# Patient Record
Sex: Female | Born: 1970 | Race: Black or African American | Hispanic: No | Marital: Married | Smoking: Never smoker
Health system: Southern US, Community
[De-identification: ages and names within clinical notes are randomized; demographics above are authoritative.]

---

## 2015-02-26 ENCOUNTER — Ambulatory Visit (INDEPENDENT_AMBULATORY_CARE_PROVIDER_SITE_OTHER): Payer: 59 | Admitting: Osteopathic Medicine

## 2015-02-26 ENCOUNTER — Encounter: Payer: Self-pay | Admitting: Osteopathic Medicine

## 2015-02-26 VITALS — BP 137/83 | HR 83 | Ht 68.0 in | Wt 159.0 lb

## 2015-02-26 DIAGNOSIS — R8290 Unspecified abnormal findings in urine: Secondary | ICD-10-CM

## 2015-02-26 DIAGNOSIS — R634 Abnormal weight loss: Secondary | ICD-10-CM | POA: Diagnosis not present

## 2015-02-26 DIAGNOSIS — IMO0001 Reserved for inherently not codable concepts without codable children: Secondary | ICD-10-CM

## 2015-02-26 DIAGNOSIS — K5900 Constipation, unspecified: Secondary | ICD-10-CM

## 2015-02-26 DIAGNOSIS — R221 Localized swelling, mass and lump, neck: Secondary | ICD-10-CM | POA: Diagnosis not present

## 2015-02-26 DIAGNOSIS — K5909 Other constipation: Secondary | ICD-10-CM

## 2015-02-26 DIAGNOSIS — Z1239 Encounter for other screening for malignant neoplasm of breast: Secondary | ICD-10-CM

## 2015-02-26 DIAGNOSIS — R519 Headache, unspecified: Secondary | ICD-10-CM | POA: Insufficient documentation

## 2015-02-26 DIAGNOSIS — R51 Headache: Secondary | ICD-10-CM | POA: Diagnosis not present

## 2015-02-26 DIAGNOSIS — R030 Elevated blood-pressure reading, without diagnosis of hypertension: Secondary | ICD-10-CM

## 2015-02-26 DIAGNOSIS — G8929 Other chronic pain: Secondary | ICD-10-CM

## 2015-02-26 DIAGNOSIS — Z13220 Encounter for screening for lipoid disorders: Secondary | ICD-10-CM

## 2015-02-26 MED ORDER — NAPROXEN 500 MG PO TABS: 500.0000 mg | ORAL_TABLET | Freq: Two times a day (BID) | ORAL | Status: DC | PRN

## 2015-02-26 MED ORDER — BISACODYL 5 MG PO TBEC: 5.0000 mg | DELAYED_RELEASE_TABLET | Freq: Every day | ORAL | Status: DC | PRN

## 2015-02-26 MED ORDER — AMITRIPTYLINE HCL 25 MG PO TABS: 25.0000 mg | ORAL_TABLET | Freq: Every day | ORAL | Status: DC

## 2015-02-26 NOTE — Patient Instructions (Signed)
We have ordered blood work to get done today, will call you with the results of this, we should have these back in the next day or two. Please let us know if you do not hear anything by Friday of this week.  You should hear from our imaging department about scheduling the mammogram and ultrasound of the neck, please let us know if you don't hear anything back by the end of this week. We typically get the report back the day after you have the imaging studies done. Depending on what the radiologist says about the lump in the neck, we may need to pursue further imaging based on their recommendations.  See instructions below for other lifestyle modifications to help with chronic constipation, you have also be given a prescription for stool softener to take as needed or can take daily for 1 - 2 weeks. If your symptoms aren't better in the next week or 2, we will need to talk more about this issue.  For headaches, you have been given 2 medications. First, Naprosyn, if the anti-inflammatory to take as needed. The other, amitriptyline, is to be taken at bedtime as a daily medication to help prevent headache. You can start this medicine at one pill per night for about a week, if you're doing well on this but not experiencing full relief you may increase to 2 pills per night. Do not go over 2 pills without talking first with Dr. Sheppard Coil. If the medications are not helping your headache, or if your headaches change in character or intensity, we will need to consider MRI or neurology referral for further workup.   Constipation, Adult Constipation is when a person has fewer than three bowel movements a week, has difficulty having a bowel movement, or has stools that are dry, hard, or larger than normal. As people grow older, constipation is more common. A low-fiber diet, not taking in enough fluids, and taking certain medicines may make constipation worse.  CAUSES   Certain medicines, such as antidepressants,  pain medicine, iron supplements, antacids, and water pills.   Certain diseases, such as diabetes, irritable bowel syndrome (IBS), thyroid disease, or depression.   Not drinking enough water.   Not eating enough fiber-rich foods.   Stress or travel.   Lack of physical activity or exercise.   Ignoring the urge to have a bowel movement.   Using laxatives too much.  SIGNS AND SYMPTOMS   Having fewer than three bowel movements a week.   Straining to have a bowel movement.   Having stools that are hard, dry, or larger than normal.   Feeling full or bloated.   Pain in the lower abdomen.   Not feeling relief after having a bowel movement.  DIAGNOSIS  Your health care provider will take a medical history and perform a physical exam. Further testing may be done for severe constipation. Some tests may include:  A barium enema X-ray to examine your rectum, colon, and, sometimes, your small intestine.   A sigmoidoscopy to examine your lower colon.   A colonoscopy to examine your entire colon. TREATMENT  Treatment will depend on the severity of your constipation and what is causing it. Some dietary treatments include drinking more fluids and eating more fiber-rich foods. Lifestyle treatments may include regular exercise. If these diet and lifestyle recommendations do not help, your health care provider may recommend taking over-the-counter laxative medicines to help you have bowel movements. Prescription medicines may be prescribed if over-the-counter medicines do not  work.  HOME CARE INSTRUCTIONS   Eat foods that have a lot of fiber, such as fruits, vegetables, whole grains, and beans.  Limit foods high in fat and processed sugars, such as french fries, hamburgers, cookies, candies, and soda.   A fiber supplement may be added to your diet if you cannot get enough fiber from foods.   Drink enough fluids to keep your urine clear or pale yellow.   Exercise regularly  or as directed by your health care provider.   Go to the restroom when you have the urge to go. Do not hold it.   Only take over-the-counter or prescription medicines as directed by your health care provider. Do not take other medicines for constipation without talking to your health care provider first.  [place] IF:   You have bright red blood in your stool.   Your constipation lasts for more than 4 days or gets worse.   You have abdominal or rectal pain.   You have thin, pencil-like stools.   You have unexplained weight loss. MAKE SURE YOU:   Understand these instructions.  Will watch your condition.  Will get help right away if you are not doing well or get worse.   This information is not intended to replace advice given to you by your health care provider. Make sure you discuss any questions you have with your health care provider.   Document Released: 10/04/2003 Document Revised: 01/26/2014 Document Reviewed: 10/17/2012 Elsevier Interactive Patient Education [employer].

## 2015-02-26 NOTE — Progress Notes (Signed)
HPI: Wendy Hoover is a 45 y.o. female who presents to [facility] today for chief complaint of:  Chief Complaint  Patient presents with  . Establish Care    LUMP ON NECK . Location: back of neck . Quality: lump, nontender . Duration: been there maybe 6 months . Context: seems getting bigger  . Assoc signs/symptoms: no fever/chills, no neck pain, no numbness/tingling in arms  HEADACHES . Location: all over, no unilateral, (+) pain in back of neck w/ headaches, too . Quality: throbbing . Duration: can go on for 3 days. Started years ago with occasional severe headache maybe once per year.  . Timing: wakes up with these in the morning, dont waler her up at night, not assoc w/ periods or food . Context: used to have migraines maybe once per year  . Modifying factors: Excedrin migraine helps, wers off then she tries some more  . Assoc signs/symptoms: phonophobia, photophobia, no aura symptoms   WEIGHT LOSS - 20 lbs over past year or so, about a year ago was 160s or so. Not doing anything different, no diet changes and no exercise. Assoc signs/symptoms: constipation, fatigue. No hir/skin changes, no palpitations.   CONSTIPATION - can go up to a week without BM, painful to defecate, no blood in stool, tried to keep hydrated, nothing really seems t ohelp.       Past medical, social and family history reviewed: No past medical history on file. No past surgical history on file. Social History  Substance Use Topics  . Smoking status: Not on file  . Smokeless tobacco: Not on file  . Alcohol Use: Not on file   History reviewed. No pertinent family history.  No current outpatient prescriptions on file.   No current facility-administered medications for this visit.   No Known Allergies    Review of Systems: CONSTITUTIONAL:  No  fever, no chills, (+) unintentional weight changes HEAD/EYES/EARS/NOSE/THROAT: (+) headache, no vision change, no hearing  change, No  sore throat, No  sinus pressure CARDIAC: No  chest pain, No  pressure, No palpitations, No  orthopnea RESPIRATORY: No  cough, No  shortness of breath/wheeze GASTROINTESTINAL: No  nausea, No  vomiting, No  abdominal pain, No  blood in stool, No  diarrhea, No  constipation  MUSCULOSKELETAL: No  myalgia/arthralgia GENITOURINARY: No  incontinence, No  abnormal genital bleeding/discharge SKIN: No  Rash/wounds, (+) concerning lesion on neck HEM/ONC: No  easy bruising/bleeding, No  abnormal lymph node ENDOCRINE: No polyuria/polydipsia/polyphagia, No  heat/cold intolerance  NEUROLOGIC: No  weakness, No  dizziness, No  slurred speech PSYCHIATRIC: No  concerns with depression, (+) concerns with anxiety, No sleep problems  Exam:  BP 137/83 mmHg  Pulse 83  Ht _0  (1.727 m)  Wt 159 lb (72.122 kg)  BMI 24.18 kg/m2  Constitutional: VS see above. General Appearance: alert, well-developed, well-nourished, NAD Eyes: Normal lids and conjunctive, non-icteric sclera, PERRLA, EOMI Ears, Nose, Mouth, Throat: MMM, Normal external inspection ears/nares/mouth/lips/gums, TM normal bilaterally. Pharynx no erythema, no exudate.  Neck: No masses, trachea midline. No thyroid enlargement/tenderness/mass appreciated. No lymphadenopathy Respiratory: Normal respiratory effort. no wheeze, no rhonchi, no rales Cardiovascular: S1/S2 normal, no murmur, no rub/gallop auscultated. RRR. No lower extremity edema. Gastrointestinal: Nontender, no masses. No hepatomegaly, no splenomegaly. No hernia appreciated. Bowel sounds normal. Rectal exam deferred.  Musculoskeletal: Gait normal. No clubbing/cyanosis of digits.  Neurological: No cranial nerve deficit on limited exam. Motor and sensation intact and symmetric, normal coordination, EOMI, normal  neck rom Skin: warm, dry, intact. No rash/ulcer. No concerning nevi or subq nodules on limited exam.   Psychiatric: Normal judgment/insight. Normal mood and affect. Oriented x3.     No results found for this or any previous visit (from the past 72 hour(s)).    ASSESSMENT/PLAN: labs and basic w/u as below, will get US neck mass, unable to tell if cyst or adipose tissue but lack of concerning neuro symptoms is reassuring. May be causing tension headaches. Will initiate ppx therapy and prn NSAID for headache, consider MRI/neuro referral, ER precautions reviewed. Weight loss has been gradual, will catch up on cancer screening. Constipaiton also a concern, treat as below and await lab results, may need to consider colonoscopy.   Unintended weight loss - Plan: US Soft Tissue Head/Neck, CBC with Differential/Platelet, COMPLETE METABOLIC PANEL WITH GFR, HIV antibody, TSH, VITAMIN D 25 Hydroxy (Vit-D Deficiency, Fractures), Lactate Dehydrogenase (LDH), Sed Rate (ESR), C-reactive protein, Urinalysis  Chronic nonintractable headache, unspecified headache type - Plan: amitriptyline (ELAVIL) 25 MG tablet, naproxen (NAPROSYN) 500 MG tablet  Lump on neck - Plan: US Soft Tissue Head/Neck  Breast cancer screening - Plan: MM DIGITAL SCREENING BILATERAL  Lipid screening - Plan: Lipid panel  Elevated blood pressure - improved on recheck - Plan: CBC with Differential/Platelet, COMPLETE METABOLIC PANEL WITH GFR, TSH, Urinalysis  Chronic constipation - Plan: bisacodyl (BISACODYL) 5 MG EC tablet    Return if symptoms worsen or fail to improve, and depending on lab results - we will call you.Marland Kitchen

## 2015-02-27 LAB — CBC WITH DIFFERENTIAL/PLATELET
Basophils Absolute: 0.1 10*3/uL (ref 0.0–0.1)
Basophils Relative: 1 % (ref 0–1)
Eosinophils Absolute: 0.1 10*3/uL (ref 0.0–0.7)
Eosinophils Relative: 1 % (ref 0–5)
HCT: 38.3 % (ref 36.0–46.0)
Hemoglobin: 12.7 g/dL (ref 12.0–15.0)
Lymphocytes Relative: 31 % (ref 12–46)
Lymphs Abs: 2 10*3/uL (ref 0.7–4.0)
MCH: 28.2 pg (ref 26.0–34.0)
MCHC: 33.2 g/dL (ref 30.0–36.0)
MCV: 84.9 fL (ref 78.0–100.0)
MPV: 10.9 fL (ref 8.6–12.4)
Monocytes Absolute: 0.5 10*3/uL (ref 0.1–1.0)
Monocytes Relative: 7 % (ref 3–12)
Neutro Abs: 4 10*3/uL (ref 1.7–7.7)
Neutrophils Relative %: 60 % (ref 43–77)
Platelets: 294 10*3/uL (ref 150–400)
RBC: 4.51 MIL/uL (ref 3.87–5.11)
RDW: 14.6 % (ref 11.5–15.5)
WBC: 6.6 10*3/uL (ref 4.0–10.5)

## 2015-02-27 LAB — COMPLETE METABOLIC PANEL WITH GFR
ALT: 7 U/L (ref 6–29)
AST: 17 U/L (ref 10–30)
Albumin: 4.1 g/dL (ref 3.6–5.1)
Alkaline Phosphatase: 64 U/L (ref 33–115)
BUN: 9 mg/dL (ref 7–25)
CO2: 25 mmol/L (ref 20–31)
Calcium: 8.8 mg/dL (ref 8.6–10.2)
Chloride: 104 mmol/L (ref 98–110)
Creat: 0.77 mg/dL (ref 0.50–1.10)
GFR, Est African American: >89 mL/min (ref >=60)
GFR, Est Non African American: >89 mL/min (ref >=60)
Glucose, Bld: 73 mg/dL (ref 65–99)
Potassium: 3.7 mmol/L (ref 3.5–5.3)
Sodium: 138 mmol/L (ref 135–146)
Total Bilirubin: 0.7 mg/dL (ref 0.2–1.2)
Total Protein: 7.7 g/dL (ref 6.1–8.1)

## 2015-02-27 LAB — LACTATE DEHYDROGENASE: LDH: 172 U/L (ref 94–250)

## 2015-02-27 LAB — URINALYSIS
Bilirubin Urine: NEGATIVE
Glucose, UA: NEGATIVE
Ketones, ur: NEGATIVE
Leukocytes, UA: NEGATIVE
Nitrite: NEGATIVE
Specific Gravity, Urine: 1.026 (ref 1.001–1.035)
pH: 6 (ref 5.0–8.0)

## 2015-02-27 LAB — HIV ANTIBODY (ROUTINE TESTING W REFLEX): HIV 1&2 Ab, 4th Generation: NONREACTIVE

## 2015-02-27 LAB — TSH: TSH: 1.01 mIU/L

## 2015-02-27 LAB — LIPID PANEL
CHOL/HDL RATIO: 3.8 ratio (ref <=5.0)
Cholesterol: 172 mg/dL (ref 125–200)
HDL: 45 mg/dL — AB (ref >=46)
LDL CALC: 108 mg/dL (ref <130)
Triglycerides: 93 mg/dL (ref <150)
VLDL: 19 mg/dL (ref <30)

## 2015-02-27 LAB — C-REACTIVE PROTEIN

## 2015-02-27 LAB — VITAMIN D 25 HYDROXY (VIT D DEFICIENCY, FRACTURES): VIT D 25 HYDROXY: 17 ng/mL — AB (ref 30–100)

## 2015-02-27 LAB — SEDIMENTATION RATE: Sed Rate: 15 mm/hr (ref 0–20)

## 2015-02-27 NOTE — Addendum Note (Signed)
Addended by: Maryla Morrow on: 02/27/2015 11:09 AM   Modules accepted: Orders

## 2015-02-28 LAB — URINALYSIS, MICROSCOPIC ONLY
BACTERIA UA: NONE SEEN [HPF]
CASTS: NONE SEEN [LPF]
SQUAMOUS EPITHELIAL / LPF: NONE SEEN [HPF] (ref <=5)
WBC, UA: NONE SEEN WBC/HPF (ref <=5)
YEAST: NONE SEEN [HPF]

## 2015-03-13 ENCOUNTER — Ambulatory Visit (INDEPENDENT_AMBULATORY_CARE_PROVIDER_SITE_OTHER): Payer: 59

## 2015-03-13 DIAGNOSIS — R221 Localized swelling, mass and lump, neck: Secondary | ICD-10-CM | POA: Diagnosis not present

## 2015-03-13 DIAGNOSIS — R634 Abnormal weight loss: Secondary | ICD-10-CM

## 2015-03-13 DIAGNOSIS — Z1231 Encounter for screening mammogram for malignant neoplasm of breast: Secondary | ICD-10-CM | POA: Diagnosis not present

## 2015-03-13 DIAGNOSIS — Z1239 Encounter for other screening for malignant neoplasm of breast: Secondary | ICD-10-CM

## 2015-03-25 ENCOUNTER — Ambulatory Visit (INDEPENDENT_AMBULATORY_CARE_PROVIDER_SITE_OTHER): Payer: 59

## 2015-03-25 DIAGNOSIS — R221 Localized swelling, mass and lump, neck: Secondary | ICD-10-CM

## 2015-03-25 DIAGNOSIS — D170 Benign lipomatous neoplasm of skin and subcutaneous tissue of head, face and neck: Secondary | ICD-10-CM

## 2015-03-25 MED ORDER — GADOBENATE DIMEGLUMINE 529 MG/ML IV SOLN
15.0000 mL | Freq: Once | INTRAVENOUS | Status: AC | PRN
  Administered 2015-03-25: 14 mL via INTRAVENOUS

## 2015-03-26 ENCOUNTER — Ambulatory Visit (INDEPENDENT_AMBULATORY_CARE_PROVIDER_SITE_OTHER): Payer: 59 | Admitting: Osteopathic Medicine

## 2015-03-26 VITALS — BP 135/85 | HR 81 | Ht 68.0 in | Wt 161.0 lb

## 2015-03-26 DIAGNOSIS — G44209 Tension-type headache, unspecified, not intractable: Secondary | ICD-10-CM | POA: Diagnosis not present

## 2015-03-26 DIAGNOSIS — K5900 Constipation, unspecified: Secondary | ICD-10-CM

## 2015-03-26 DIAGNOSIS — D170 Benign lipomatous neoplasm of skin and subcutaneous tissue of head, face and neck: Secondary | ICD-10-CM

## 2015-03-26 DIAGNOSIS — R634 Abnormal weight loss: Secondary | ICD-10-CM | POA: Diagnosis not present

## 2015-03-26 DIAGNOSIS — K5909 Other constipation: Secondary | ICD-10-CM

## 2015-03-26 NOTE — Progress Notes (Signed)
HPI: Wendy Hoover is a 45 y.o. female who presents to [facility] today for chief complaint of:  Chief Complaint  Patient presents with  . Follow-up    MRI RESULTS    LUMP ON NECK - recent MRI after [place] inconclusive, c/w lipoma. Advised can consider surgical referral for removal or wait and let it be, monitor for any changes or worsening.   HEADACHES - Naproxen is helping whenever headache starts. Using this maybe once or twice per week. Elavil made her drowsy during the day so she didn't like it.   WEIGHT LOSS - 20 lbs over past year or so, about a year ago was 160s or so. Not doing anything different, no diet changes and no exercise but noting maybe eats a bit less, constipation is an issue. Lab w/u negative, results reviewed w/ patient.   CONSTIPATION - has been bothering her for some time. Can go up to a week without BM, painful to defecate, no blood in stool, tried to keep hydrated, these measures don't really seems to help. Discussed this briefly at last visit, she has only used 1 tab of the bisacodyl on occasion.   Past medical, social and family history reviewed: No past medical history on file. No past surgical history on file. Social History  Substance Use Topics  . Smoking status: Never Smoker   . Smokeless tobacco: Not on file  . Alcohol Use: Not on file   Family History  Problem Relation Age of Onset  . Hyperlipidemia Mother   . Diabetes Son     BOTH SONS HAVE JUVENILE DIABETES  . Hypertension Maternal Grandmother   . Hyperlipidemia Maternal Grandmother   . Cancer Father     lymphoma  . Cancer Paternal Aunt     breast    Current Outpatient Prescriptions  Medication Sig Dispense Refill  . amitriptyline (ELAVIL) 25 MG tablet Take 1 tablet (25 mg total) by mouth at bedtime. Can increase to 2 tablets (50 mg total) by mouth at bedtime after 1 week. 60 tablet 0  . bisacodyl (BISACODYL) 5 MG EC tablet Take 1-2 tablets (5-10 mg total) by  mouth daily as needed for moderate constipation. 30 tablet 0  . naproxen (NAPROSYN) 500 MG tablet Take 1 tablet (500 mg total) by mouth 2 (two) times daily as needed for headache. 30 tablet 0   No current facility-administered medications for this visit.   No Known Allergies    Review of Systems: CONSTITUTIONAL:  No  fever, no chills, (+) unintentional weight changes HEAD/EYES/EARS/NOSE/THROAT: (+) headache but improved CARDIAC: No  chest pain RESPIRATORY: No  cough, No  shortness of breath GASTROINTESTINAL: No  nausea, No  vomiting, No  abdominal pain, No  blood in stool, No  diarrhea, (+) constipation   Exam:  BP 134/107 mmHg  Pulse 81  Ht 5\' 8"  (1.727 m)  Wt 161 lb (73.029 kg)  BMI 24.49 kg/m2  LMP 03/03/2015  Constitutional: VS see above. General Appearance: alert, well-developed, well-nourished, NAD Eyes: Normal lids and conjunctive, non-icteric sclera,  Ears, Nose, Mouth, Throat: MMM, Normal external inspection ears/nares/mouth/lips/gums, Respiratory: Normal respiratory effort. no wheeze, no rhonchi, no rales Cardiovascular: S1/S2 normal, no murmur, no rub/gallop auscultated. RRR. No lower extremity edema. Psychiatric: Normal judgment/insight. Normal mood and affect. Oriented    No results found for this or any previous visit (from the past 72 hour(s)).   MRI images personally reviewed and also reviewed with the patient - all questions answered.  ASSESSMENT/PLAN: stable weight since last month - actually 3 lb gain. No previous records. Follow closely, see if constipation improves and appetite gets better, consider CT C/A/P and/or scope depending on whether or not she improves vs gets worse. RTC precautions reviewed, pt amenable to this plan - see below for details.   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 Consider CT/scope  Tension headache - Controlled on 1 - 2 x/week NSAID, pt advised RTC if needing more frequently or if headaches change/worsen   Return in about 6 months (around 09/26/2015), or sooner if needed for weight, constipation, headache as directed -, for weight check.

## 2015-09-18 ENCOUNTER — Encounter: Payer: 59 | Admitting: Obstetrics & Gynecology

## 2015-10-03 ENCOUNTER — Encounter: Payer: 59 | Admitting: Obstetrics and Gynecology

## 2015-10-03 DIAGNOSIS — Z01419 Encounter for gynecological examination (general) (routine) without abnormal findings: Secondary | ICD-10-CM

## 2016-04-07 ENCOUNTER — Ambulatory Visit: Payer: 59 | Admitting: Osteopathic Medicine

## 2016-04-09 ENCOUNTER — Encounter: Payer: 59 | Admitting: Osteopathic Medicine

## 2016-05-15 ENCOUNTER — Ambulatory Visit (INDEPENDENT_AMBULATORY_CARE_PROVIDER_SITE_OTHER): Payer: Managed Care, Other (non HMO) | Admitting: Osteopathic Medicine

## 2016-05-15 ENCOUNTER — Encounter: Payer: Self-pay | Admitting: Osteopathic Medicine

## 2016-05-15 ENCOUNTER — Other Ambulatory Visit (HOSPITAL_COMMUNITY)
Admission: RE | Admit: 2016-05-15 | Discharge: 2016-05-15 | Disposition: A | Discharge status: Home / Self Care | Payer: Managed Care, Other (non HMO) | Source: Ambulatory Visit | Attending: Osteopathic Medicine | Admitting: Osteopathic Medicine

## 2016-05-15 VITALS — BP 133/84 | HR 83 | Resp 18 | Ht 68.0 in | Wt 170.0 lb

## 2016-05-15 DIAGNOSIS — Z0000 Encounter for general adult medical examination without abnormal findings: Secondary | ICD-10-CM | POA: Insufficient documentation

## 2016-05-15 DIAGNOSIS — F3289 Other specified depressive episodes: Secondary | ICD-10-CM | POA: Insufficient documentation

## 2016-05-15 DIAGNOSIS — G4730 Sleep apnea, unspecified: Secondary | ICD-10-CM | POA: Diagnosis not present

## 2016-05-15 DIAGNOSIS — B9689 Other specified bacterial agents as the cause of diseases classified elsewhere: Secondary | ICD-10-CM

## 2016-05-15 DIAGNOSIS — R238 Other skin changes: Secondary | ICD-10-CM

## 2016-05-15 DIAGNOSIS — R0602 Shortness of breath: Secondary | ICD-10-CM

## 2016-05-15 DIAGNOSIS — F4321 Adjustment disorder with depressed mood: Secondary | ICD-10-CM | POA: Diagnosis not present

## 2016-05-15 DIAGNOSIS — R233 Spontaneous ecchymoses: Secondary | ICD-10-CM

## 2016-05-15 DIAGNOSIS — N760 Acute vaginitis: Secondary | ICD-10-CM

## 2016-05-15 DIAGNOSIS — Z113 Encounter for screening for infections with a predominantly sexual mode of transmission: Secondary | ICD-10-CM

## 2016-05-15 NOTE — Progress Notes (Signed)
HPI: Wendy Hoover is a 46 y.o. female  who presents to [facility] today, 05/15/16,  for chief complaint of:  Chief Complaint  Patient presents with  . Annual Exam    See below for review of preventive care  (+)Depression screening: Situational factors at play, went patient's children was diagnosed with brain tumor back in December, has been taking care of him. Overall he is doing well but another child was recently sentenced to life in prison. She states she thinks she might need some time off of work to cope with this. Mother is currently staying with her but will be leaving soon and then has little in the way of family support with other children at home. Ages range from 27 to mid 17s, everyone relies on her for most household tasks and managing the family. Passive death wish that no active suicide or hurting others. Significant anxiety as well. Reports occasionally waking up at night feeling shortness of breath/choking feeling. No shortness of breath on exertion other than patient just feels out of shape. No chest pain/palpitations.    Past medical, surgical, social and family history reviewed: Patient Active Problem List   Diagnosis Date Noted  . 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 Unintended weight loss 02/26/2015  . Cephalalgia 02/26/2015  . Breast cancer screening 02/26/2015  . Lipid screening 02/26/2015  . Elevated blood pressure 02/26/2015  . Chronic constipation 02/26/2015   No past surgical history on file.   Social History  Substance Use Topics  . Smoking status: Never Smoker  . Smokeless tobacco: Never Used  . Alcohol use Not on file   Family History  Problem Relation Age of Onset  . Hyperlipidemia Mother   . Diabetes Son     BOTH SONS HAVE JUVENILE DIABETES  . Hypertension Maternal Grandmother   . Hyperlipidemia Maternal Grandmother   . Cancer Father     lymphoma  . Cancer Paternal Aunt     breast     Current medication list and  allergy/intolerance information reviewed:   No current outpatient prescriptions on file.   No current facility-administered medications for this visit.    Allergies  Allergen Reactions  . Shellfish Allergy Hives and Swelling      Review of Systems:  Constitutional:  No  fever, no chills, No recent illness, No unintentional weight changes. No significant fatigue.   HEENT: No  headache, no vision change, no hearing change, No sore throat, No  sinus pressure  Cardiac: No  chest pain, No  pressure, No palpitations, No  Orthopnea  Respiratory:  +shortness of breath. No  Cough  Gastrointestinal: No  abdominal pain, No  nausea, No  vomiting,  No  blood in stool, No  diarrhea, No  constipation   Musculoskeletal: No new myalgia/arthralgia  Genitourinary: No  incontinence, No  abnormal genital bleeding, No abnormal genital discharge  Skin: No  Rash, No other wounds/concerning lesions  Hem/Onc: +easy bruising/bleeding - no bloody stool or heavy periods, No  abnormal lymph node  Endocrine: No cold intolerance,  No heat intolerance. No polyuria/polydipsia/polyphagia   Neurologic: No  weakness, No  dizziness, No  slurred speech/focal weakness/facial droop  Psychiatric: +concerns with depression, +concerns with anxiety, +sleep problems, No mood problems  Exam:  BP 133/84   Pulse 83   Resp 18   Ht 5\' 8"  (1.727 m)   Wt 170 lb (77.1 kg)   BMI 25.85 kg/m   Constitutional: VS see above. General Appearance: alert,  well-developed, well-nourished, NAD  Eyes: Normal lids and conjunctive, non-icteric sclera  Ears, Nose, Mouth, Throat: MMM, Normal external inspection ears/nares/mouth/lips/gums.  Neck: No masses, trachea midline. No thyroid enlargement. No tenderness/mass appreciated. No lymphadenopathy  Respiratory: Normal respiratory effort. no wheeze, no rhonchi, no rales  Cardiovascular: S1/S2 normal, no murmur, no rub/gallop auscultated. RRR. No lower extremity  edema.  Gastrointestinal: Nontender, no masses.  Musculoskeletal: Gait normal. No clubbing/cyanosis of digits.   Neurological: Normal balance/coordination. No tremor.   Skin: warm, dry, intact. No rash/ulcer.  Psychiatric: Normal judgment/insight. Normal mood and affect. Oriented x3. i/HI. No thought disorder. GYN: No lesions/ulcers to external genitalia, normal urethra, normal vaginal mucosa, physiologic discharge, cervix normal without lesions, uterus not enlarged or tender, adnexa no masses and nontender  BREAST: No rashes/skin changes, normal fibrous breast tissue, no masses or tenderness, normal nipple without discharge, normal axilla    ASSESSMENT/PLAN:   Annual physical exam - Plan: CBC with Differential/Platelet, COMPLETE METABOLIC PANEL WITH GFR, Lipid panel, TSH, HIV antibody, VITAMIN D 25 Hydroxy (Vit-D Deficiency, Fractures), Cytology - PAP  Situational depression - Plan: Ambulatory referral to Psychology  Sleep disorder breathing - Plan: DG Chest 2 View  Easy bruising - Plan: CBC with Differential/Platelet  Routine screening for STI (sexually transmitted infection) - Plan: HIV antibody, Hepatitis C antibody, RPR, Hepatitis B core antibody, total, Hepatitis B surface antigen, Cytology - PAP  Waking at night short of breath - Plan: DG Chest 2 View   FEMALE PREVENTIVE CARE Updated 05/15/16   ANNUAL SCREENING/COUNSELING  Diet/Exercise - HEALTHY HABITS DISCUSSED TO DECREASE CV RISK History  Smoking Status  . Never Smoker  Smokeless Tobacco  . Never Used   History  Alcohol use Not on file   Depression screen [facility] 2/9 05/15/2016  Decreased Interest 3  Down, Depressed, Hopeless 3  PHQ - 2 Score 6  Altered sleeping 2  Tired, decreased energy 2  Change in appetite 2  Feeling bad or failure about yourself  3  Trouble concentrating 3  Moving slowly or fidgety/restless 2  Suicidal thoughts 1  PHQ-9 Score 21    Domestic violence concerns - no  HTN SCREENING  - SEE [place]  Sexually active in the past year - Yes with female.  Need/want STI testing today? - yes  Concerns about libido or pain with sex? - no  Plans for pregnancy? - none - currently abstinent  INFECTIOUS DISEASE SCREENING  HIV - needs  GC/CT - needs  HepC - DOB [birth year] - does not need  TB - does not need  DISEASE SCREENING  Lipid - needs  DM2 - needs  Osteoporosis - women age 68+ - does not need  CANCER SCREENING  Cervical - needs  Breast - Prefers every two-year screening until age 30  Lung - does not need  Colon - does not need  ADULT VACCINATION  Influenza - annual vaccine recommended  Td - booster every 10 years   Zoster - option at 60, yes at 60+   PCV13 - was not indicated  PPSV23 - was not indicated  OTHER  Fall - exercise and Vit D age 25+ - does not need  Consider ASA - age 53-59 - does not need      Patient Instructions  Plan  1. Routine pap, STI screening and lab work today - you should get a call about these results within one week, please call us if you don't hear back!  2. Will refer to counseling  for assistance with time off work and help with the stressors you are dealing with. If you'd like to discuss medication management or if you experience thoughts of hurting yourself or others, please seek medical attention!   3. Will get labs and chest Xray for breathing issues - I suspect anxiety problems more than something like sleep apnea or other cause, but if your symptoms persist or get worse we should evaluate further - please come see me in the next few weeks if this issue doesn't improve, sooner if it becomes worse or more frequent.     Please note: Preventive care issues were addressed today per annual physical requirements and should be covered under your insurance, however there were other medical issues including depression concerns and breathing symptoms which were also addressed and insurance may bill  you separately for "problem-based visit." Any questions or concerns about charges which may appear on your billing statement should be directed to your insurance company or to [facility] billing department, please contact our office with any other questions.     Visit summary with medication list and pertinent instructions was printed for patient to review. All questions at time of visit were answered - patient instructed to contact office with any additional concerns. ER/RTC precautions were reviewed with the patient. Follow-up plan: Return in about 1 year (around 05/15/2017) for [place], or in 2-4 weeks if breathing/chest symptoms persist/worsen .  Note: Total time spent 15 minutes on positive depression screening and associated symptoms of shortness of breath and disordered sleep., greater than 50% of the visit was spent face-to-face counseling and coordinating care. This problem-based evaluation was performed in addition to routine preventive care

## 2016-05-15 NOTE — Patient Instructions (Addendum)
Plan  1. Routine pap, STI screening and lab work today - you should get a call about these results within one week, please call us if you don't hear back!  2. Will refer to counseling for assistance with time off work and help with the stressors you are dealing with. If you'd like to discuss medication management or if you experience thoughts of hurting yourself or others, please seek medical attention!   3. Will get labs and chest Xray for breathing issues - I suspect anxiety problems more than something like sleep apnea or other cause, but if your symptoms persist or get worse we should evaluate further - please come see me in the next few weeks if this issue doesn't improve, sooner if it becomes worse or more frequent.     Please note: Preventive care issues were addressed today per annual physical requirements and should be covered under your insurance, however there were other medical issues including depression concerns and breathing symptoms which were also addressed and insurance may bill you separately for "problem-based visit." Any questions or concerns about charges which may appear on your billing statement should be directed to your insurance company or to [facility] billing department, please contact our office with any other questions.

## 2016-05-16 LAB — COMPLETE METABOLIC PANEL WITH GFR
AG RATIO: 1.2 ratio (ref 1.0–2.5)
ALK PHOS: 59 U/L (ref 33–115)
ALT: 10 U/L (ref 6–29)
AST: 17 U/L (ref 10–30)
Albumin: 4 g/dL (ref 3.6–5.1)
BILIRUBIN TOTAL: 0.7 mg/dL (ref 0.2–1.2)
BUN/Creatinine Ratio: 11.9 Ratio (ref 6–22)
BUN: 10 mg/dL (ref 7–25)
CALCIUM: 8.8 mg/dL (ref 8.6–10.2)
CO2: 25 mmol/L (ref 20–31)
CREATININE: 0.84 mg/dL (ref 0.50–1.10)
Chloride: 107 mmol/L (ref 98–110)
GFR, EST NON AFRICAN AMERICAN: 85 mL/min (ref >=60)
GFR, Est African American: >89 mL/min (ref >=60)
GLOBULIN: 3.3 g/dL (ref 1.9–3.7)
GLUCOSE: 93 mg/dL (ref 65–99)
Potassium: 3.7 mmol/L (ref 3.5–5.3)
Sodium: 140 mmol/L (ref 135–146)
TOTAL PROTEIN: 7.3 g/dL (ref 6.1–8.1)

## 2016-05-16 LAB — CBC WITH DIFFERENTIAL/PLATELET
BASOS ABS: 70 {cells}/uL (ref 0–200)
Basophils Relative: 1 %
EOS ABS: 70 {cells}/uL (ref 15–500)
Eosinophils Relative: 1 %
HEMATOCRIT: 38.3 % (ref 35.0–45.0)
HEMOGLOBIN: 12.4 g/dL (ref 11.7–15.5)
LYMPHS ABS: 2660 {cells}/uL (ref 850–3900)
Lymphocytes Relative: 38 %
MCH: 27.1 pg (ref 27.0–33.0)
MCHC: 32.4 g/dL (ref 32.0–36.0)
MCV: 83.8 fL (ref 80.0–100.0)
MONO ABS: 490 {cells}/uL (ref 200–950)
MPV: 10.8 fL (ref 7.5–12.5)
Monocytes Relative: 7 %
NEUTROS PCT: 53 %
Neutro Abs: 3710 cells/uL (ref 1500–7800)
Platelets: 280 10*3/uL (ref 140–400)
RBC: 4.57 MIL/uL (ref 3.80–5.10)
RDW: 14.9 % (ref 11.0–15.0)
WBC: 7 10*3/uL (ref 3.8–10.8)

## 2016-05-16 LAB — LIPID PANEL
Cholesterol: 167 mg/dL (ref <200)
HDL: 48 mg/dL — AB (ref >50)
LDL CALC: 98 mg/dL (ref <100)
Total CHOL/HDL Ratio: 3.5 Ratio (ref <5.0)
Triglycerides: 105 mg/dL (ref <150)
VLDL: 21 mg/dL (ref <30)

## 2016-05-16 LAB — HIV ANTIBODY (ROUTINE TESTING W REFLEX): HIV: NONREACTIVE

## 2016-05-16 LAB — RPR

## 2016-05-16 LAB — HEPATITIS B CORE ANTIBODY, TOTAL: Hep B Core Total Ab: NONREACTIVE

## 2016-05-16 LAB — TSH: TSH: 1.71 m[IU]/L

## 2016-05-16 LAB — HEPATITIS C ANTIBODY: HCV AB: NEGATIVE

## 2016-05-16 LAB — HEPATITIS B SURFACE ANTIGEN: Hepatitis B Surface Ag: NEGATIVE

## 2016-05-16 LAB — VITAMIN D 25 HYDROXY (VIT D DEFICIENCY, FRACTURES): VIT D 25 HYDROXY: 24 ng/mL — AB (ref 30–100)

## 2016-05-19 LAB — CYTOLOGY - PAP
Adequacy: ABSENT
BACTERIAL VAGINITIS: POSITIVE — AB
CANDIDA VAGINITIS: NEGATIVE
Chlamydia: NEGATIVE
Diagnosis: NEGATIVE
HPV (WINDOPATH): NOT DETECTED
NEISSERIA GONORRHEA: NEGATIVE
TRICH (WINDOWPATH): NEGATIVE

## 2016-05-19 MED ORDER — METRONIDAZOLE 500 MG PO TABS: 500.0000 mg | ORAL_TABLET | Freq: Two times a day (BID) | ORAL | 0 refills | Status: DC

## 2016-05-19 NOTE — Addendum Note (Signed)
Addended by: Maryla Morrow on: 05/19/2016 02:18 PM   Modules accepted: Orders

## 2016-06-18 ENCOUNTER — Ambulatory Visit: Payer: Self-pay | Admitting: Psychology

## 2017-09-11 IMAGING — MR MR NECK SOFT TISSUE ONLY WO/W CM
6 of 9 series · 32 of 48 positions shown · IV contrast (multihance)
Comparison: Neck soft tissue ultrasound 03/13/2015.

CLINICAL DATA: 44-year-old female with a painless posterior neck
palpable mass on the left side discovered 1 year ago. Subsequent
encounter.

EXAM:
MR NECK SOFT TISSUE ONLY WITHOUT AND WITH CONTRAST
TECHNIQUE: Multiplanar, multisequence MR imaging was performed both before and
after administration of intravenous contrast.
CONTRAST:  14mL MULTIHANCE GADOBENATE DIMEGLUMINE 529 MG/ML IV SOLN

[Series 2: T1 · coronal · 4.0mm · 0.45mm/px · 5 of 23 slices shown (1 of 3)]
[im 1/23]
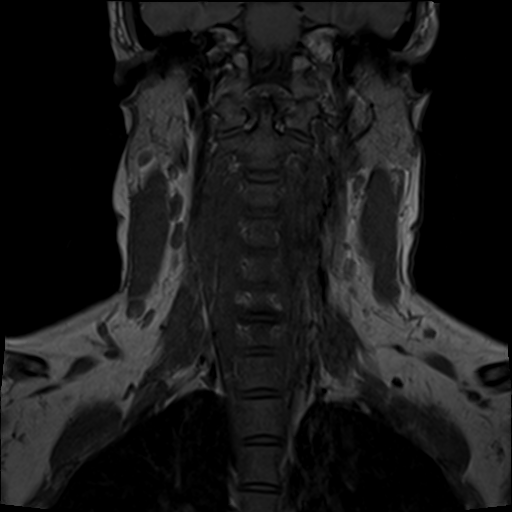
[im 6/23]
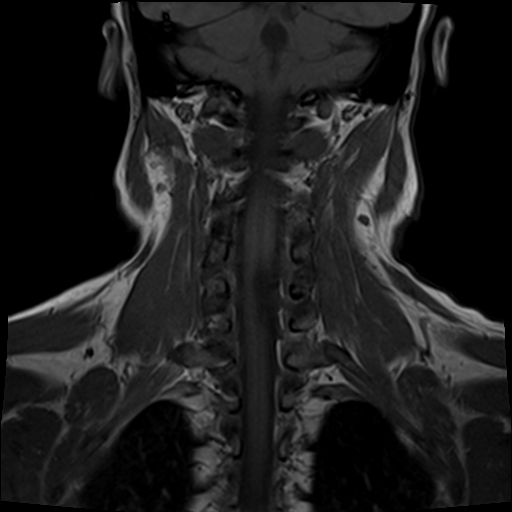
[im 12/23]
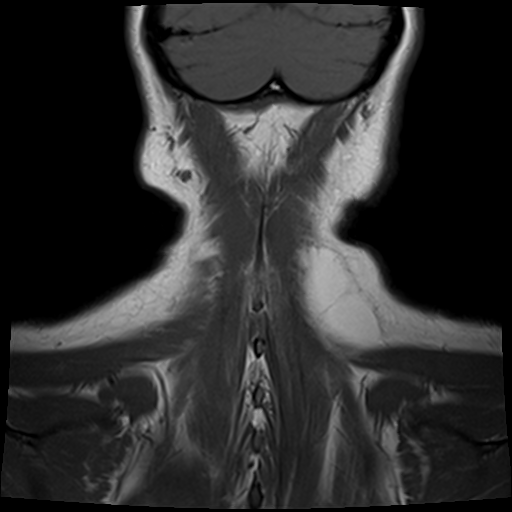
[im 17/23]
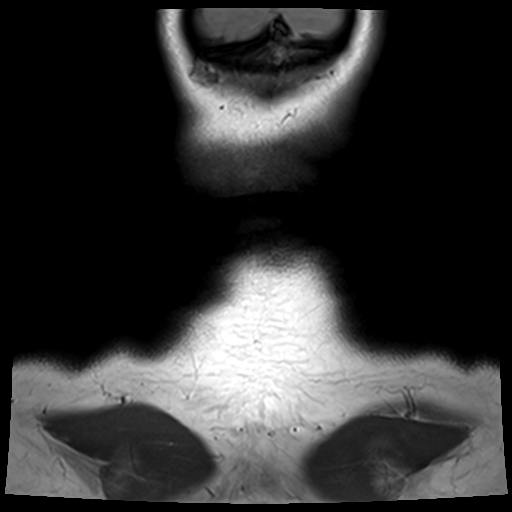
[im 23/23]
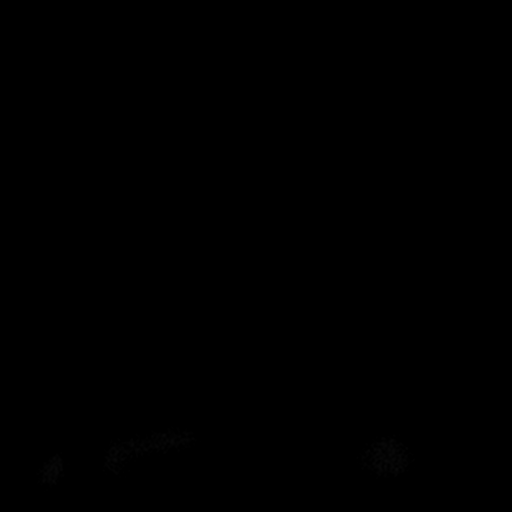

[Series 3: T1 · sagittal · 4.0mm · 0.69mm/px · 7 of 35 slices shown (2 of 3)]
[im 1/35]
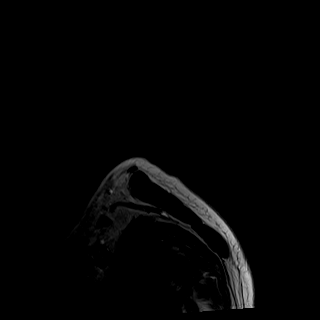
[im 6/35]
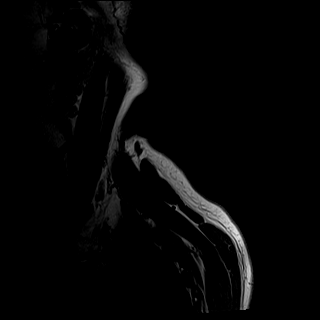
[im 12/35]
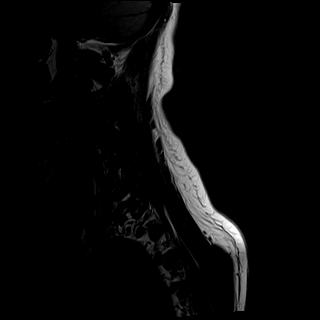
[im 18/35]
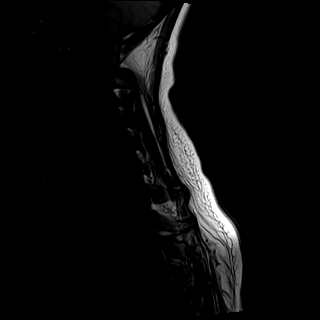
[im 23/35]
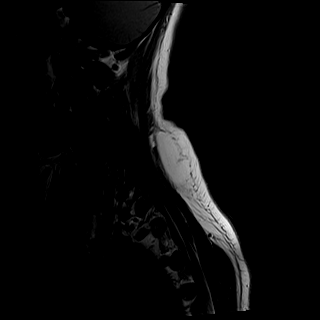
[im 29/35]
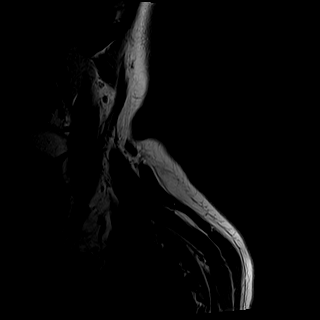
[im 35/35]
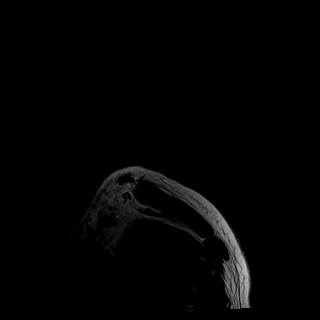

[Series 4: T1 · axial · 6.0mm · 0.86mm/px · z∈[-88,+30]mm · 4 of 18 slices shown (3 of 3)]
[im 1/18]
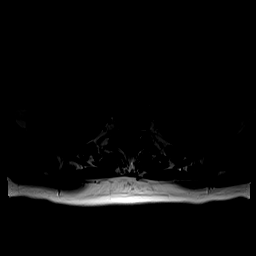
[im 6/18]
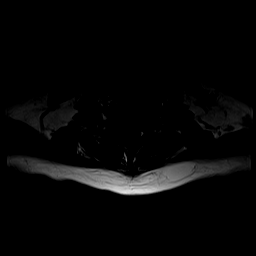
[im 12/18]
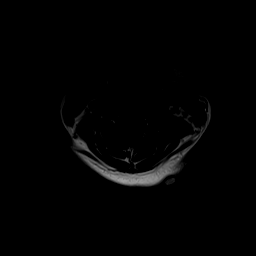
[im 18/18]
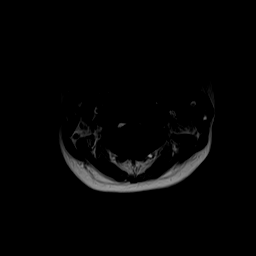

[Series 9: T1 fat-sat post-contrast · coronal · 4.0mm · 0.45mm/px · 5 of 23 slices shown (1 of 3)]
[im 1/23]
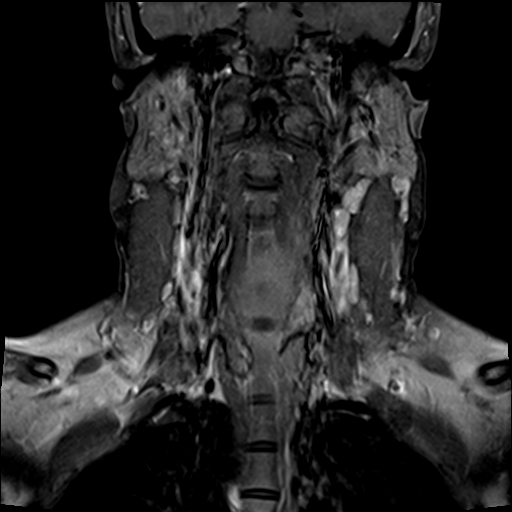
[im 6/23]
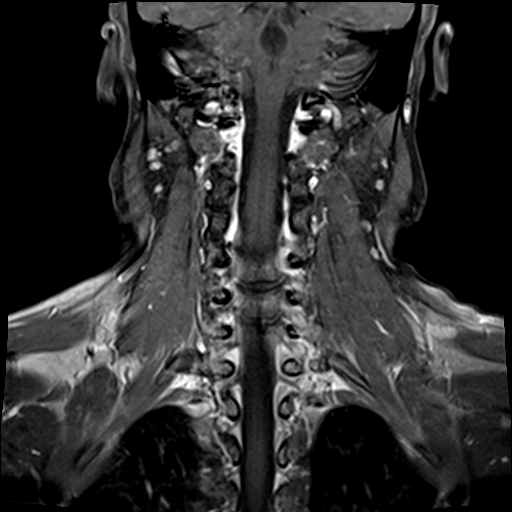
[im 12/23]
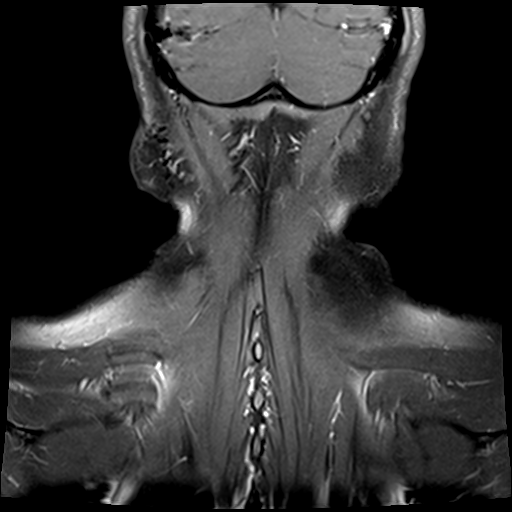
[im 17/23]
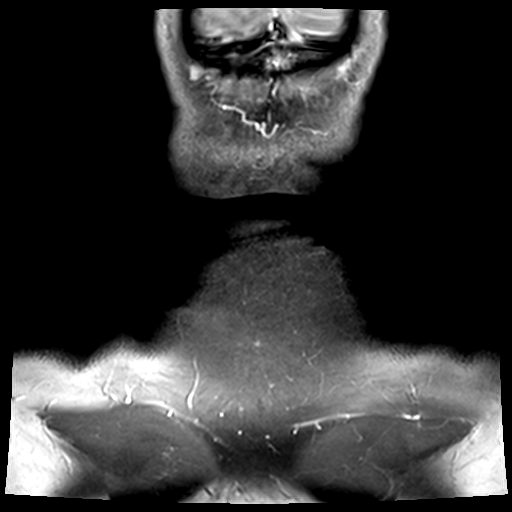
[im 23/23]
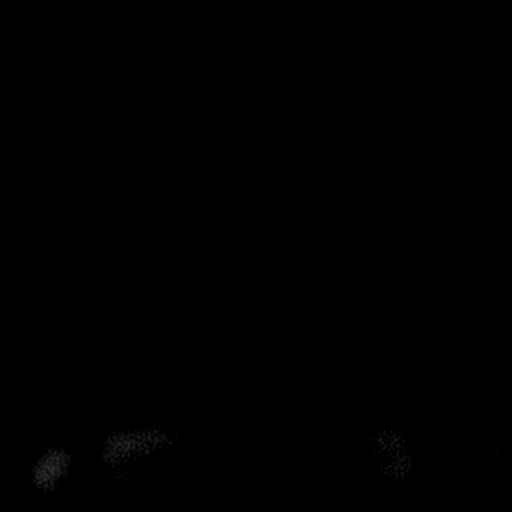

[Series 10: T1 fat-sat post-contrast · axial · 6.0mm · 0.86mm/px · z∈[-88,+30]mm · 4 of 18 slices shown (2 of 3)]
[im 1/18]
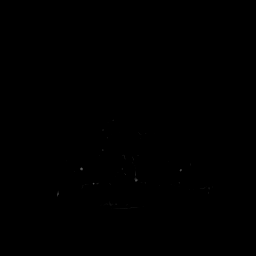
[im 6/18]
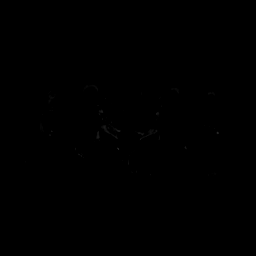
[im 12/18]
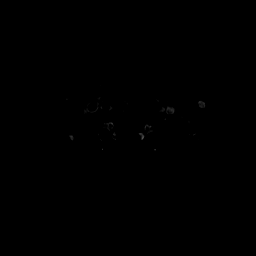
[im 18/18]
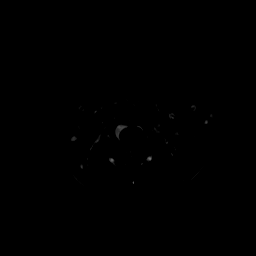

[Series 11: T1 fat-sat post-contrast · sagittal · 4.0mm · 0.69mm/px · 7 of 35 slices shown (3 of 3)]
[im 1/35]
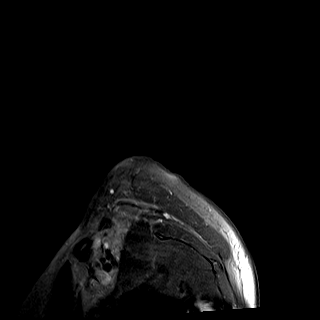
[im 6/35]
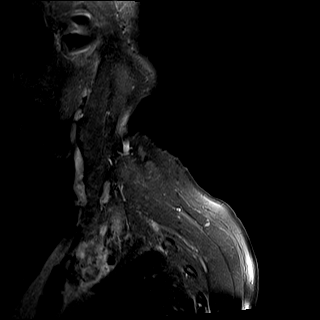
[im 12/35]
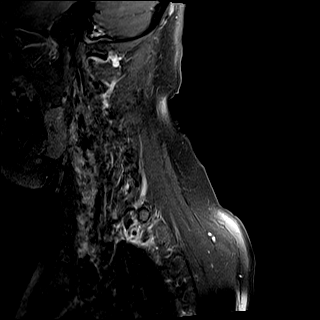
[im 18/35]
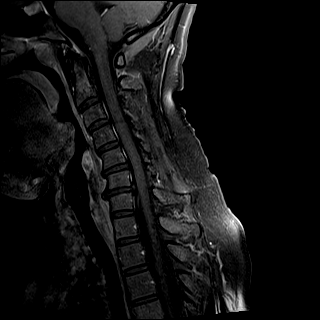
[im 23/35]
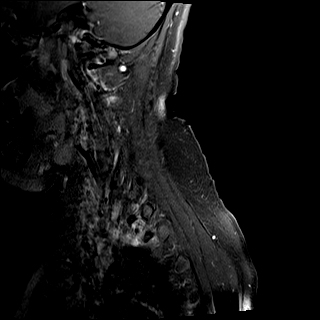
[im 29/35]
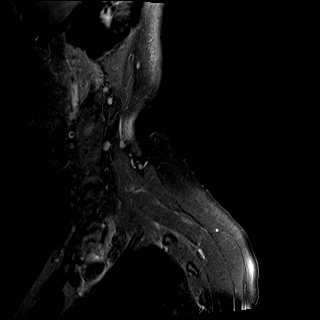
[im 35/35]
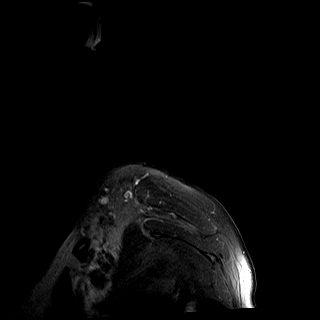

[32 of 48 positions shown; findings below may reference images not displayed]

FINDINGS: A skin marker placed along the posterior left neck is situated at
the cephalad end of an oval, encapsulated, subcutaneous mass which
is isointense to fat. The lesion tracks to the left from the midline
and encompasses 17 x 52 x 51 mm (AP by transverse by CC). Although
there are several small septations and/or vessels within the mass,
it overall appears simple and benign, with no suspicious
enhancement. The underlying muscle layer appears normal. No
associated inflammation or lymphadenopathy.

Negative visualized lung apices and superior mediastinum.

Mild generalized thyroid enlargement (series 7, image 13), but no
discrete thyroid nodule or mass. Larynx, visualized pharynx,
parapharyngeal spaces, retropharyngeal space, sublingual space,
submandibular glands, and parotid glands are within normal limits.
Bilateral cervical lymph nodes are within normal limits, the largest
are level IIa nodes measuring up to 8 mm short axis. No abnormal
enhancement identified.

Mild reversal of lower cervical lordosis. No marrow edema or
evidence of acute osseous abnormality. Lower cervical disc
degeneration. No definite cervical spinal stenosis. No abnormal
spinal cord signal. Negative visualized brain parenchyma. Visualized
mastoid air cells and paranasal sinuses are clear.
IMPRESSION: 1. Posterior left palpable abnormality corresponds to a benign
appearing lipoma measuring 1.7 x 5.2 x 5.1 cm.
2. Borderline to mild thyroid enlargement with no discrete thyroid
nodule.
3. Otherwise negative neck soft tissues.

## 2018-01-26 ENCOUNTER — Ambulatory Visit (INDEPENDENT_AMBULATORY_CARE_PROVIDER_SITE_OTHER): Payer: BLUE CROSS/BLUE SHIELD | Admitting: Family Medicine

## 2018-01-26 ENCOUNTER — Encounter: Payer: Self-pay | Admitting: Family Medicine

## 2018-01-26 VITALS — BP 148/74 | HR 97 | Ht 68.0 in | Wt 154.0 lb

## 2018-01-26 DIAGNOSIS — Z202 Contact with and (suspected) exposure to infections with a predominantly sexual mode of transmission: Secondary | ICD-10-CM

## 2018-01-26 DIAGNOSIS — Z113 Encounter for screening for infections with a predominantly sexual mode of transmission: Secondary | ICD-10-CM | POA: Diagnosis not present

## 2018-01-26 DIAGNOSIS — M545 Low back pain, unspecified: Secondary | ICD-10-CM

## 2018-01-26 MED ORDER — AZITHROMYCIN 1 G PO PACK
1.0000 g | PACK | Freq: Once | ORAL | Status: AC
  Administered 2018-01-26: 1 g via ORAL

## 2018-01-26 MED ORDER — CEFTRIAXONE SODIUM 250 MG IJ SOLR
250.0000 mg | Freq: Once | INTRAMUSCULAR | Status: AC
  Administered 2018-01-26: 250 mg via INTRAMUSCULAR

## 2018-01-26 NOTE — Progress Notes (Signed)
Subjective:    Patient ID: Wendy Hoover, female    DOB: December 29, 1970, 48 y.o.   MRN: [id number]  HPI 48 year old female of Dr. Redgie Grayer comes in today for possible STD.  She says her boyfriend/recent sexual partner called her today and let her know that he was positive for gonorrhea.  She really has not had any abnormal pelvic discharge etc.  She has noticed just a little bit of cramping and some low back pain over the last day or so.  No fever, chills or sweats.   Review of Systems  BP (!) 148/74   Pulse 97   Ht 5\' 8"  (1.727 m)   Wt 154 lb (69.9 kg)   LMP 01/13/2018   SpO2 100%   BMI 23.42 kg/m     Allergies  Allergen Reactions  . Shellfish Allergy Hives and Swelling    No past medical history on file.  No past surgical history on file.  Social History   Socioeconomic History  . Marital status: Married    Spouse name: Not on file  . Number of children: Not on file  . Years of education: Not on file  . Highest education level: Not on file  Occupational History  . Not on file  Social Needs  . Financial resource strain: Not on file  . Food insecurity:    Worry: Not on file    Inability: Not on file  . Transportation needs:    Medical: Not on file    Non-medical: Not on file  Tobacco Use  . Smoking status: Never Smoker  . Smokeless tobacco: Never Used  Substance and Sexual Activity  . Alcohol use: Not on file  . Drug use: Not on file  . Sexual activity: Not on file  Lifestyle  . Physical activity:    Days per week: Not on file    Minutes per session: Not on file  . Stress: Not on file  Relationships  . Social connections:    Talks on phone: Not on file    Gets together: Not on file    Attends religious service: Not on file    Active member of club or organization: Not on file    Attends meetings of clubs or organizations: Not on file    Relationship status: Not on file  . Intimate partner violence:    Fear of current or ex partner: Not on file     Emotionally abused: Not on file    Physically abused: Not on file    Forced sexual activity: Not on file  Other Topics Concern  . Not on file  Social History Narrative  . Not on file    Family History  Problem Relation Age of Onset  . Hyperlipidemia Mother   . Diabetes Son        BOTH SONS HAVE JUVENILE DIABETES  . Hypertension Maternal Grandmother   . Hyperlipidemia Maternal Grandmother   . Cancer Father        lymphoma  . Cancer Paternal Aunt        breast    Outpatient Encounter Medications as of 01/26/2018  Medication Sig  . [DISCONTINUED] metroNIDAZOLE (FLAGYL) 500 MG tablet Take 1 tablet (500 mg total) by mouth 2 (two) times daily. For 7 days   Facility-Administered Encounter Medications as of 01/26/2018  Medication  . azithromycin (ZITHROMAX) powder 1 g  . cefTRIAXone (ROCEPHIN) injection 250 mg         Objective:   Physical Exam  Vitals signs reviewed.  Constitutional:      Appearance: She is well-developed.  HENT:     Head: Normocephalic and atraumatic.  Eyes:     Conjunctiva/sclera: Conjunctivae normal.  Cardiovascular:     Rate and Rhythm: Normal rate.  Pulmonary:     Effort: Pulmonary effort is normal.  Skin:    General: Skin is dry.     Coloration: Skin is not pale.  Neurological:     Mental Status: She is alert and oriented to person, place, and time.  Psychiatric:        Behavior: Behavior normal.           Assessment & Plan:  STD exposure - will treat prophylactically with Rocephin 250mg  IM x 1 and azithr 1 gram slurry today.  GC chlamydia and wet prep collected.  Will call with results once available.  She should be doing blood work when she comes in for her physical next week.  Recommend that she have HIV and RPR done at that time.  Low Back pain - If she continues to have some low back pain after treatment then encouraged her to follow-up with Dr. Sheppard Coil next week at her appointment.  Could be that she also just has some  musculoskeletal pain.

## 2018-01-27 LAB — WET PREP FOR TRICH, YEAST, CLUE
MICRO NUMBER:: 27659
Specimen Quality: ADEQUATE

## 2018-01-27 LAB — C. TRACHOMATIS/N. GONORRHOEAE RNA
C. trachomatis RNA, TMA: NOT DETECTED
N. GONORRHOEAE RNA, TMA: NOT DETECTED

## 2018-02-02 ENCOUNTER — Encounter: Payer: Self-pay | Admitting: Osteopathic Medicine

## 2018-03-03 ENCOUNTER — Ambulatory Visit (INDEPENDENT_AMBULATORY_CARE_PROVIDER_SITE_OTHER): Payer: BLUE CROSS/BLUE SHIELD | Admitting: Osteopathic Medicine

## 2018-03-03 ENCOUNTER — Encounter: Payer: Self-pay | Admitting: Osteopathic Medicine

## 2018-03-03 ENCOUNTER — Other Ambulatory Visit (HOSPITAL_COMMUNITY)
Admission: RE | Admit: 2018-03-03 | Discharge: 2018-03-03 | Disposition: A | Discharge status: Home / Self Care | Payer: BLUE CROSS/BLUE SHIELD | Source: Ambulatory Visit | Attending: Osteopathic Medicine | Admitting: Osteopathic Medicine

## 2018-03-03 VITALS — BP 133/86 | HR 78 | Temp 98.3°F | Wt 154.5 lb

## 2018-03-03 DIAGNOSIS — Z124 Encounter for screening for malignant neoplasm of cervix: Secondary | ICD-10-CM | POA: Insufficient documentation

## 2018-03-03 DIAGNOSIS — Z13220 Encounter for screening for lipoid disorders: Secondary | ICD-10-CM

## 2018-03-03 DIAGNOSIS — Z113 Encounter for screening for infections with a predominantly sexual mode of transmission: Secondary | ICD-10-CM

## 2018-03-03 DIAGNOSIS — Z1239 Encounter for other screening for malignant neoplasm of breast: Secondary | ICD-10-CM | POA: Diagnosis not present

## 2018-03-03 DIAGNOSIS — Z0000 Encounter for general adult medical examination without abnormal findings: Secondary | ICD-10-CM | POA: Diagnosis not present

## 2018-03-03 NOTE — Patient Instructions (Addendum)
General Preventive Care  Most recent routine screening lipids/other labs: ordered today.   Cholesterol and Diabetes screening usually recommended annually.   Thyroid and Vitamin D: routine screening is not medically necessary, therefore most insurance will not cover this test as part of "free labs" on your annual physical. If you desire this testing, you may be charged for it! Please check with the lab before your blood draw if you're concerned about cost.   Everyone should have blood pressure checked once per year.   Tobacco: don't! Please let me know if you need help quitting!  Alcohol: responsible moderation is ok for most adults - if you have concerns about your alcohol intake, please talk to me!   Exercise: as tolerated to reduce risk of cardiovascular disease and diabetes. Strength training will also prevent osteoporosis.   Mental health: if need for mental health care (medicines, counseling, other), or concerns about moods, please let me know!   Sexual health: if need for STD testing, or if concerns with libido/pain problems, please let me know! If you need to discuss your birth control options, please let me know!   Advanced Directive: Living Will and/or Healthcare Power of Attorney recommended for all adults, regardless of age or health. See printed info!  Vaccines  Flu vaccine: recommended for almost everyone, every fall.   Shingles vaccine: Shingrix recommended after age 27.   Pneumonia vaccines: Prevnar and Pneumovax recommended after age 22  Tetanus booster: Tdap recommended every 10 years.  Cancer screenings   Colon cancer screening: recommended for everyone at age 31, but some folks need a colonoscopy sooner if risk factors   Breast cancer screening: mammogram recommended at age 38 every other year at least, and annually after age 28. Mammogram ordered.    Cervical cancer screening: Pap every 1 to 5 years depending on age and other risk factors. Can usually stop at  age 50 or w/ hysterectomy.   Lung cancer screening: not needed for non-smokers Infection screenings . STD: screening as needed . Hepatitis C: recommended for anyone born 44-1963 . TB: certain at-risk populations, or depending on work requirements and/or travel history Other . Bone Density Test: recommended for women at age 24

## 2018-03-03 NOTE — Progress Notes (Signed)
HPI: Wendy Hoover is a 48 y.o. female who  has no past medical history on file.  she presents to [facility] today, 03/03/18,  for chief complaint of: Annual physical     Patient here for annual physical / wellness exam.  See preventive care reviewed as below.  Recent labs reviewed in detail with the patient. (-)GC/CT/Trich. Last routine Lipids etc 04/2016  Additional concerns today include: None        Past medical, surgical, social and family history reviewed:  Patient Active Problem List   Diagnosis Date Noted  . 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 Unintended weight loss 02/26/2015  . Cephalalgia 02/26/2015  . Breast cancer screening 02/26/2015  . Lipid screening 02/26/2015  . Elevated blood pressure 02/26/2015  . Chronic constipation 02/26/2015    No past surgical history on file.  Social History   Tobacco Use  . Smoking status: Never Smoker  . Smokeless tobacco: Never Used  Substance Use Topics  . Alcohol use: Not on file    Family History  Problem Relation Age of Onset  . Hyperlipidemia Mother   . Diabetes Son        BOTH SONS HAVE JUVENILE DIABETES  . Hypertension Maternal Grandmother   . Hyperlipidemia Maternal Grandmother   . Cancer Father        lymphoma  . Cancer Paternal Aunt        breast     Current medication list and allergy/intolerance information reviewed:    No current outpatient medications on file.   No current facility-administered medications for this visit.     Allergies  Allergen Reactions  . Shellfish Allergy Hives and Swelling      Review of Systems:  Constitutional:  No  fever, no chills, No recent illness, No unintentional weight changes. No significant fatigue.   HEENT: No  headache, no vision change, no hearing change, No sore throat, No  sinus pressure  Cardiac: No  chest pain, No  pressure, No palpitations, No  Orthopnea  Respiratory:  No  shortness of breath. No   Cough  Gastrointestinal: No  abdominal pain, No  nausea, No  vomiting,  No  blood in stool, No  diarrhea, No  constipation   Musculoskeletal: No new myalgia/arthralgia  Skin: No  Rash, No other wounds/concerning lesions  Genitourinary: No  incontinence, No  abnormal genital bleeding, No abnormal genital discharge  Hem/Onc: No  easy bruising/bleeding, No  abnormal lymph node  Endocrine: No cold intolerance,  No heat intolerance. No polyuria/polydipsia/polyphagia   Neurologic: No  weakness, No  dizziness, No  slurred speech/focal weakness/facial droop  Psychiatric: No  concerns with depression, No  concerns with anxiety, No sleep problems, No mood problems  Exam:  BP 133/86 (BP Location: Left Arm, Patient Position: Sitting, Cuff Size: Normal)   Pulse 78   Temp 98.3 F (36.8 C) (Oral)   Wt 154 lb 8 oz (70.1 kg)   BMI 23.49 kg/m   Constitutional: VS see above. General Appearance: alert, well-developed, well-nourished, NAD  Eyes: Normal lids and conjunctive, non-icteric sclera  Ears, Nose, Mouth, Throat: MMM, Normal external inspection ears/nares/mouth/lips/gums. TM normal bilaterally. Pharynx/tonsils no erythema, no exudate. Nasal mucosa normal.   Neck: No masses, trachea midline. No thyroid enlargement. No tenderness/mass appreciated. No lymphadenopathy  Respiratory: Normal respiratory effort. no wheeze, no rhonchi, no rales  Cardiovascular: S1/S2 normal, no murmur, no rub/gallop auscultated. RRR. No lower extremity edema. Pedal pulse II/IV bilaterally DP and PT.  No carotid bruit or JVD. No abdominal aortic bruit.  Gastrointestinal: Nontender, no masses. No hepatomegaly, no splenomegaly. No hernia appreciated. Bowel sounds normal. Rectal exam deferred.   Musculoskeletal: Gait normal. No clubbing/cyanosis of digits.   Neurological: Normal balance/coordination. No tremor. No cranial nerve deficit on limited exam. Motor and sensation intact and symmetric. Cerebellar reflexes  intact.   Skin: warm, dry, intact. No rash/ulcer. No concerning nevi or subq nodules on limited exam.    Psychiatric: Normal judgment/insight. Normal mood and affect. Oriented x3.  GYN: No lesions/ulcers to external genitalia, normal urethra, normal vaginal mucosa, physiologic discharge, cervix normal without lesions, uterus not enlarged or tender, adnexa no masses and nontender  BREAST: No rashes/skin changes, normal fibrous breast tissue, no masses or tenderness, normal nipple without discharge, normal axilla          ASSESSMENT/PLAN: The primary encounter diagnosis was Annual physical exam. Diagnoses of Screening examination for STD (sexually transmitted disease), Breast cancer screening, Lipid screening, and Cervical cancer screening were also pertinent to this visit.   Orders Placed This Encounter  Procedures  . MM 3D SCREEN BREAST BILATERAL  . RPR  . HIV Antibody (routine testing w rflx)  . CBC  . COMPLETE METABOLIC PANEL WITH GFR  . Lipid panel  . TSH  . VITAMIN D 25 Hydroxy (Vit-D Deficiency, Fractures)      Patient Instructions  General Preventive Care  Most recent routine screening lipids/other labs: ordered today.   Cholesterol and Diabetes screening usually recommended annually.   Thyroid and Vitamin D: routine screening is not medically necessary, therefore most insurance will not cover this test as part of "free labs" on your annual physical. If you desire this testing, you may be charged for it! Please check with the lab before your blood draw if you're concerned about cost.   Everyone should have blood pressure checked once per year.   Tobacco: don't! Please let me know if you need help quitting!  Alcohol: responsible moderation is ok for most adults - if you have concerns about your alcohol intake, please talk to me!   Exercise: as tolerated to reduce risk of cardiovascular disease and diabetes. Strength training will also prevent osteoporosis.    Mental health: if need for mental health care (medicines, counseling, other), or concerns about moods, please let me know!   Sexual health: if need for STD testing, or if concerns with libido/pain problems, please let me know! If you need to discuss your birth control options, please let me know!   Advanced Directive: Living Will and/or Healthcare Power of Attorney recommended for all adults, regardless of age or health. See printed info!  Vaccines  Flu vaccine: recommended for almost everyone, every fall.   Shingles vaccine: Shingrix recommended after age 12.   Pneumonia vaccines: Prevnar and Pneumovax recommended after age 21  Tetanus booster: Tdap recommended every 10 years.  Cancer screenings   Colon cancer screening: recommended for everyone at age 46, but some folks need a colonoscopy sooner if risk factors   Breast cancer screening: mammogram recommended at age 23 every other year at least, and annually after age 47. Mammogram ordered.    Cervical cancer screening: Pap every 1 to 5 years depending on age and other risk factors. Can usually stop at age 41 or w/ hysterectomy.   Lung cancer screening: not needed for non-smokers Infection screenings . STD: screening as needed . Hepatitis C: recommended for anyone born [birth year]1965 . TB: certain at-risk populations, or depending on  work requirements and/or travel history Other . Bone Density Test: recommended for women at age 72        Visit summary with medication list and pertinent instructions was printed for patient to review. All questions at time of visit were answered - patient instructed to contact office with any additional concerns or updates. ER/RTC precautions were reviewed with the patient.   .   Please note: voice recognition software was used to produce this document, and typos may escape review. Please contact Dr. Sheppard Coil for any needed clarifications.     Follow-up plan: Return for Tdap at nurse visit  when we have this in stock (will call you), and annual 1 yr / sooner if needed.

## 2018-03-04 ENCOUNTER — Encounter: Payer: Self-pay | Admitting: Osteopathic Medicine

## 2018-03-07 LAB — CYTOLOGY - PAP
Diagnosis: NEGATIVE
HPV: NOT DETECTED

## 2018-03-16 ENCOUNTER — Ambulatory Visit: Payer: Self-pay
